# Patient Record
Sex: Female | Born: 1965 | Race: Black or African American | Hispanic: No
Health system: Southern US, Community
[De-identification: ages and names within clinical notes are randomized; demographics above are authoritative.]

---

## 2019-05-25 ENCOUNTER — Emergency Department (HOSPITAL_COMMUNITY): Payer: No Typology Code available for payment source

## 2019-05-25 ENCOUNTER — Encounter (HOSPITAL_COMMUNITY): Payer: Self-pay | Admitting: Emergency Medicine

## 2019-05-25 ENCOUNTER — Emergency Department (HOSPITAL_COMMUNITY)
Admission: EM | Admit: 2019-05-25 | Discharge: 2019-05-25 | Disposition: A | Discharge status: Home / Self Care | Payer: No Typology Code available for payment source | Attending: Emergency Medicine | Admitting: Emergency Medicine

## 2019-05-25 ENCOUNTER — Other Ambulatory Visit: Payer: Self-pay

## 2019-05-25 DIAGNOSIS — S0081XA Abrasion of other part of head, initial encounter: Secondary | ICD-10-CM | POA: Insufficient documentation

## 2019-05-25 DIAGNOSIS — Y929 Unspecified place or not applicable: Secondary | ICD-10-CM | POA: Diagnosis not present

## 2019-05-25 DIAGNOSIS — M25561 Pain in right knee: Secondary | ICD-10-CM | POA: Diagnosis not present

## 2019-05-25 DIAGNOSIS — R0789 Other chest pain: Secondary | ICD-10-CM | POA: Insufficient documentation

## 2019-05-25 DIAGNOSIS — Y939 Activity, unspecified: Secondary | ICD-10-CM | POA: Diagnosis not present

## 2019-05-25 DIAGNOSIS — S32502A Unspecified fracture of left pubis, initial encounter for closed fracture: Secondary | ICD-10-CM | POA: Diagnosis not present

## 2019-05-25 DIAGNOSIS — R109 Unspecified abdominal pain: Secondary | ICD-10-CM | POA: Diagnosis present

## 2019-05-25 DIAGNOSIS — Y999 Unspecified external cause status: Secondary | ICD-10-CM | POA: Diagnosis not present

## 2019-05-25 LAB — COMPREHENSIVE METABOLIC PANEL
ALT: 35 U/L (ref 0–44)
AST: 51 U/L — ABNORMAL HIGH (ref 15–41)
Albumin: 4 g/dL (ref 3.5–5.0)
Alkaline Phosphatase: 84 U/L (ref 38–126)
Anion gap: 11 (ref 5–15)
BUN: 15 mg/dL (ref 6–20)
CO2: 23 mmol/L (ref 22–32)
Calcium: 9.6 mg/dL (ref 8.9–10.3)
Chloride: 108 mmol/L (ref 98–111)
Creatinine, Ser: 1.16 mg/dL — ABNORMAL HIGH (ref 0.44–1.00)
GFR calc Af Amer: >60 mL/min (ref >60)
GFR calc non Af Amer: 54 mL/min — ABNORMAL LOW (ref >60)
Glucose, Bld: 102 mg/dL — ABNORMAL HIGH (ref 70–99)
Potassium: 3.6 mmol/L (ref 3.5–5.1)
Sodium: 142 mmol/L (ref 135–145)
Total Bilirubin: 0.6 mg/dL (ref 0.3–1.2)
Total Protein: 7.8 g/dL (ref 6.5–8.1)

## 2019-05-25 LAB — CBC
HCT: 39.8 % (ref 36.0–46.0)
Hemoglobin: 12.6 g/dL (ref 12.0–15.0)
MCH: 28.4 pg (ref 26.0–34.0)
MCHC: 31.7 g/dL (ref 30.0–36.0)
MCV: 89.6 fL (ref 80.0–100.0)
Platelets: 285 10*3/uL (ref 150–400)
RBC: 4.44 MIL/uL (ref 3.87–5.11)
RDW: 13.1 % (ref 11.5–15.5)
WBC: 9.2 10*3/uL (ref 4.0–10.5)
nRBC: 0 % (ref 0.0–0.2)

## 2019-05-25 LAB — URINALYSIS, ROUTINE W REFLEX MICROSCOPIC
Bilirubin Urine: NEGATIVE
Glucose, UA: NEGATIVE mg/dL
Ketones, ur: NEGATIVE mg/dL
Nitrite: NEGATIVE
Protein, ur: 30 mg/dL — AB
Specific Gravity, Urine: 1.029 (ref 1.005–1.030)
pH: 6 (ref 5.0–8.0)

## 2019-05-25 LAB — I-STAT CHEM 8, ED
BUN: 19 mg/dL (ref 6–20)
Calcium, Ion: 1.23 mmol/L (ref 1.15–1.40)
Chloride: 107 mmol/L (ref 98–111)
Creatinine, Ser: 1 mg/dL (ref 0.44–1.00)
Glucose, Bld: 98 mg/dL (ref 70–99)
HCT: 38 % (ref 36.0–46.0)
Hemoglobin: 12.9 g/dL (ref 12.0–15.0)
Potassium: 3.8 mmol/L (ref 3.5–5.1)
Sodium: 141 mmol/L (ref 135–145)
TCO2: 26 mmol/L (ref 22–32)

## 2019-05-25 LAB — SAMPLE TO BLOOD BANK

## 2019-05-25 LAB — CDS SEROLOGY

## 2019-05-25 LAB — LACTIC ACID, PLASMA: Lactic Acid, Venous: 2.6 mmol/L (ref 0.5–1.9)

## 2019-05-25 LAB — ETHANOL: Alcohol, Ethyl (B): <10 mg/dL (ref <10)

## 2019-05-25 MED ORDER — FENTANYL CITRATE (PF) 100 MCG/2ML IJ SOLN: 50.0000 ug | Freq: Once | INTRAMUSCULAR | Status: DC

## 2019-05-25 MED ORDER — IOHEXOL 300 MG/ML  SOLN
100.0000 mL | Freq: Once | INTRAMUSCULAR | Status: AC | PRN
  Administered 2019-05-25: 17:00:00 100 mL via INTRAVENOUS

## 2019-05-25 NOTE — ED Notes (Signed)
To CT

## 2019-05-25 NOTE — ED Triage Notes (Signed)
See trauma chart
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
## 2019-05-27 ENCOUNTER — Telehealth: Payer: Self-pay | Admitting: *Deleted
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
